# Patient Record
Sex: Male | Born: 1970 | Race: Asian | Hispanic: No | Marital: Single | Smoking: Never smoker
Health system: Southern US, Community
[De-identification: ages and names within clinical notes are randomized; demographics above are authoritative.]

---

## 2017-04-15 ENCOUNTER — Ambulatory Visit (INDEPENDENT_AMBULATORY_CARE_PROVIDER_SITE_OTHER): Payer: BLUE CROSS/BLUE SHIELD

## 2017-04-15 ENCOUNTER — Ambulatory Visit (INDEPENDENT_AMBULATORY_CARE_PROVIDER_SITE_OTHER): Payer: BLUE CROSS/BLUE SHIELD | Admitting: Physician Assistant

## 2017-04-15 ENCOUNTER — Other Ambulatory Visit: Payer: Self-pay

## 2017-04-15 ENCOUNTER — Encounter: Payer: Self-pay | Admitting: Physician Assistant

## 2017-04-15 VITALS — BP 150/93 | HR 71 | Resp 16 | Ht 66.0 in | Wt 185.2 lb

## 2017-04-15 DIAGNOSIS — M7989 Other specified soft tissue disorders: Secondary | ICD-10-CM | POA: Diagnosis not present

## 2017-04-15 DIAGNOSIS — M25471 Effusion, right ankle: Secondary | ICD-10-CM | POA: Diagnosis not present

## 2017-04-15 DIAGNOSIS — S99911A Unspecified injury of right ankle, initial encounter: Secondary | ICD-10-CM | POA: Diagnosis not present

## 2017-04-15 MED ORDER — NAPROXEN 500 MG PO TABS: 500.0000 mg | ORAL_TABLET | Freq: Two times a day (BID) | ORAL | 0 refills | Status: AC
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
## 2017-04-15 NOTE — Patient Instructions (Addendum)
Rest and elevate the affected painful area.  Apply cold compresses intermittently as needed.  As pain recedes, begin normal activities slowly as tolerated.  Call if symptoms persist.    IF you received an x-ray today, you will receive an invoice from [facility]. Please contact [facility] at [phone] with questions or concerns regarding your invoice.   IF you received labwork today, you will receive an invoice from [facility]. Please contact [facility] at [phone] with questions or concerns regarding your invoice.   Our billing staff will not be able to assist you with questions regarding bills from these companies.  You will be contacted with the lab results as soon as they are available. The fastest way to get your results is to activate your My Chart account. Instructions are located on the last page of this paperwork. If you have not heard from us regarding the results in 2 weeks, please contact this office.

## 2017-04-24 ENCOUNTER — Encounter: Payer: Self-pay | Admitting: Physician Assistant

## 2017-04-24 ENCOUNTER — Ambulatory Visit (INDEPENDENT_AMBULATORY_CARE_PROVIDER_SITE_OTHER): Payer: BLUE CROSS/BLUE SHIELD

## 2017-04-24 ENCOUNTER — Ambulatory Visit (INDEPENDENT_AMBULATORY_CARE_PROVIDER_SITE_OTHER): Payer: BLUE CROSS/BLUE SHIELD | Admitting: Physician Assistant

## 2017-04-24 VITALS — BP 130/84 | HR 90 | Temp 98.0°F | Resp 16 | Ht 66.0 in | Wt 186.6 lb

## 2017-04-24 DIAGNOSIS — M25571 Pain in right ankle and joints of right foot: Secondary | ICD-10-CM
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
## 2017-04-24 NOTE — Patient Instructions (Signed)
     IF you received an x-ray today, you will receive an invoice from [facility]. Please contact [facility] at [phone] with questions or concerns regarding your invoice.   IF you received labwork today, you will receive an invoice from [facility]. Please contact [facility] at [phone] with questions or concerns regarding your invoice.   Our billing staff will not be able to assist you with questions regarding bills from these companies.  You will be contacted with the lab results as soon as they are available. The fastest way to get your results is to activate your My Chart account. Instructions are located on the last page of this paperwork. If you have not heard from us regarding the results in 2 weeks, please contact this office.     

## 2017-05-09 ENCOUNTER — Other Ambulatory Visit: Payer: Self-pay | Admitting: Physician Assistant

## 2017-05-12 ENCOUNTER — Other Ambulatory Visit: Payer: Self-pay | Admitting: Physician Assistant

## 2017-07-28 ENCOUNTER — Ambulatory Visit: Payer: BLUE CROSS/BLUE SHIELD | Admitting: Physician Assistant

## 2017-09-05 ENCOUNTER — Ambulatory Visit: Payer: BLUE CROSS/BLUE SHIELD | Admitting: Physician Assistant

## 2017-11-22 IMAGING — DX DG ANKLE COMPLETE 3+V*R*
3 series · 3 of 3 positions shown · non-contrast
Comparison: 04/15/2017

CLINICAL DATA: RIGHT ankle pain, previously suspected fracture

EXAM:
RIGHT ANKLE - COMPLETE 3+ VIEW

[ankle ap]
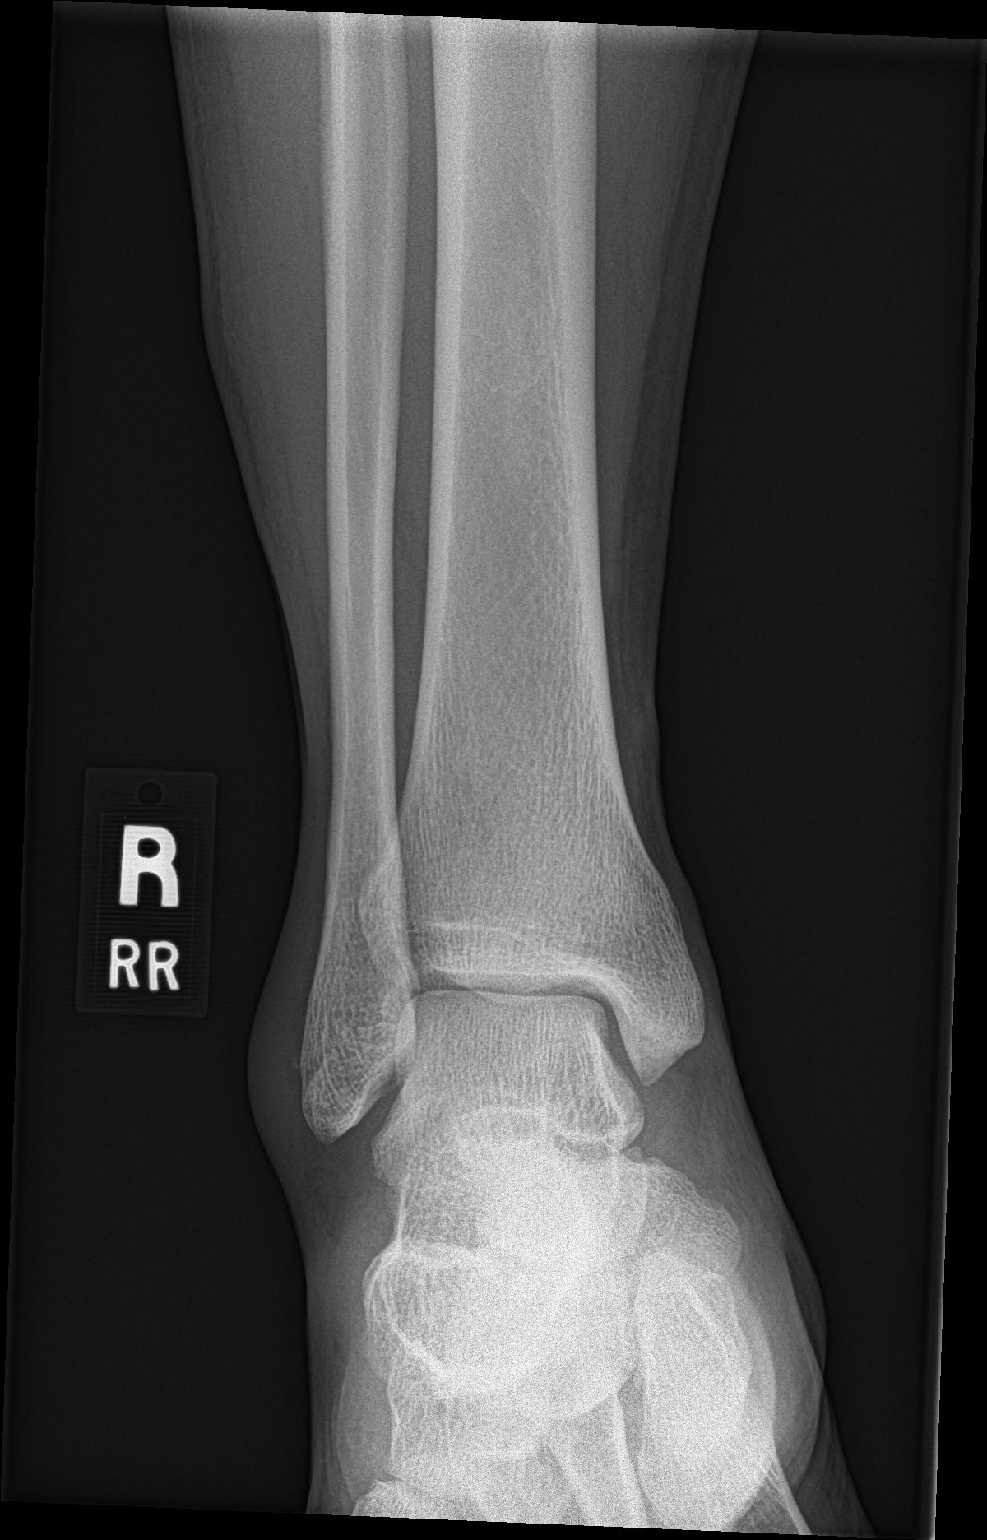

[ankle obl]
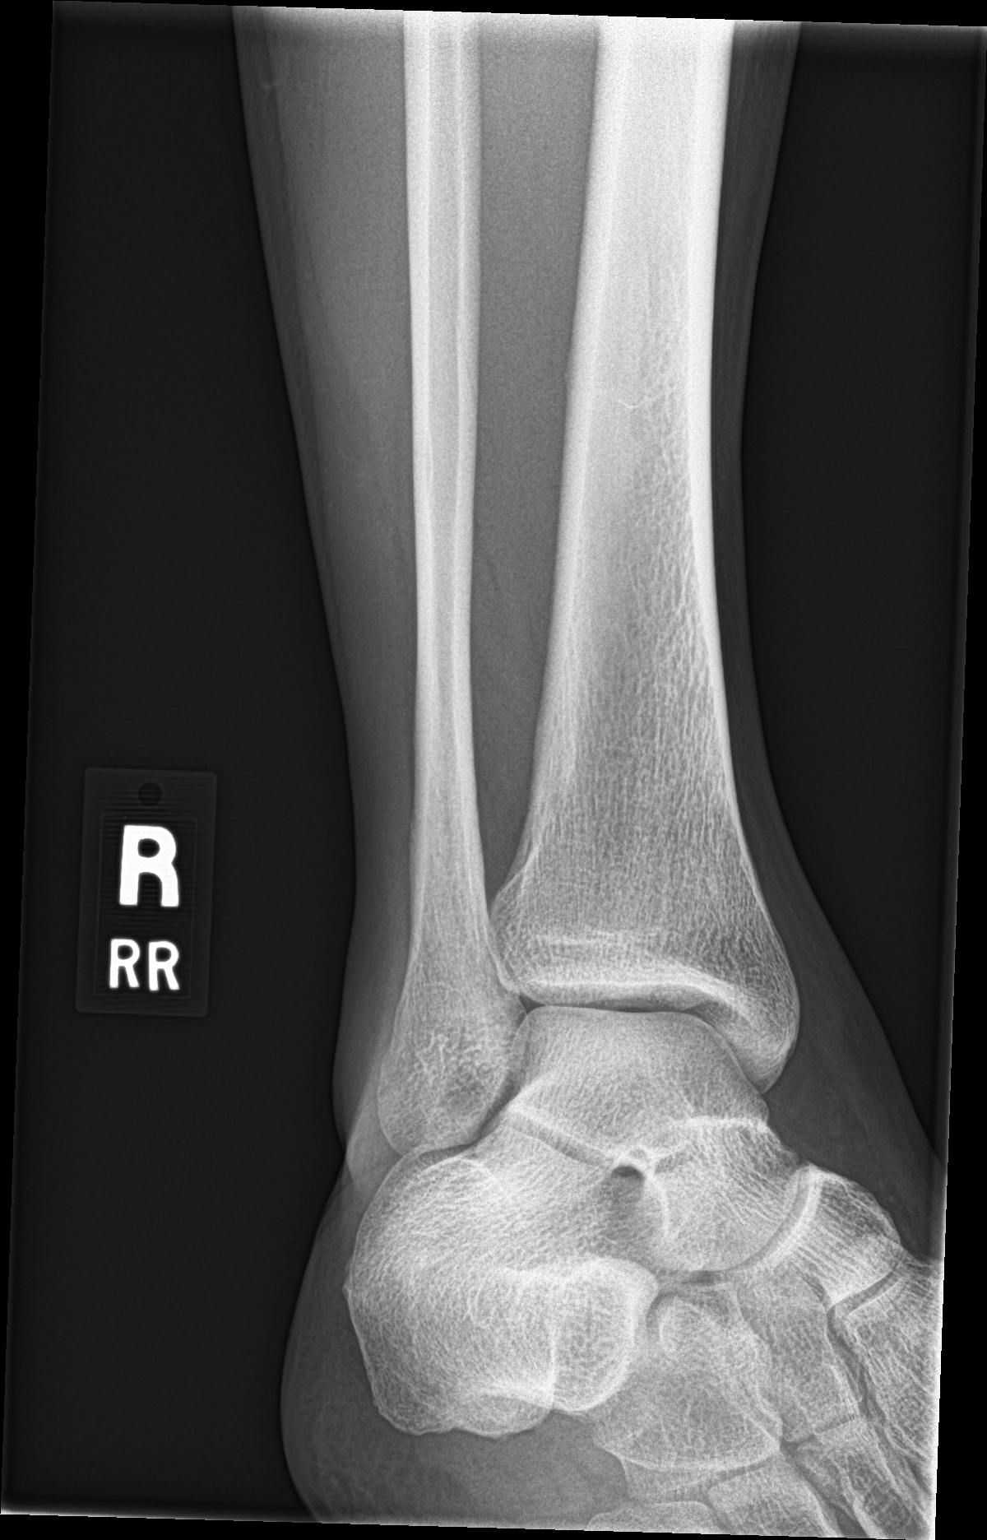

[ankle lat]
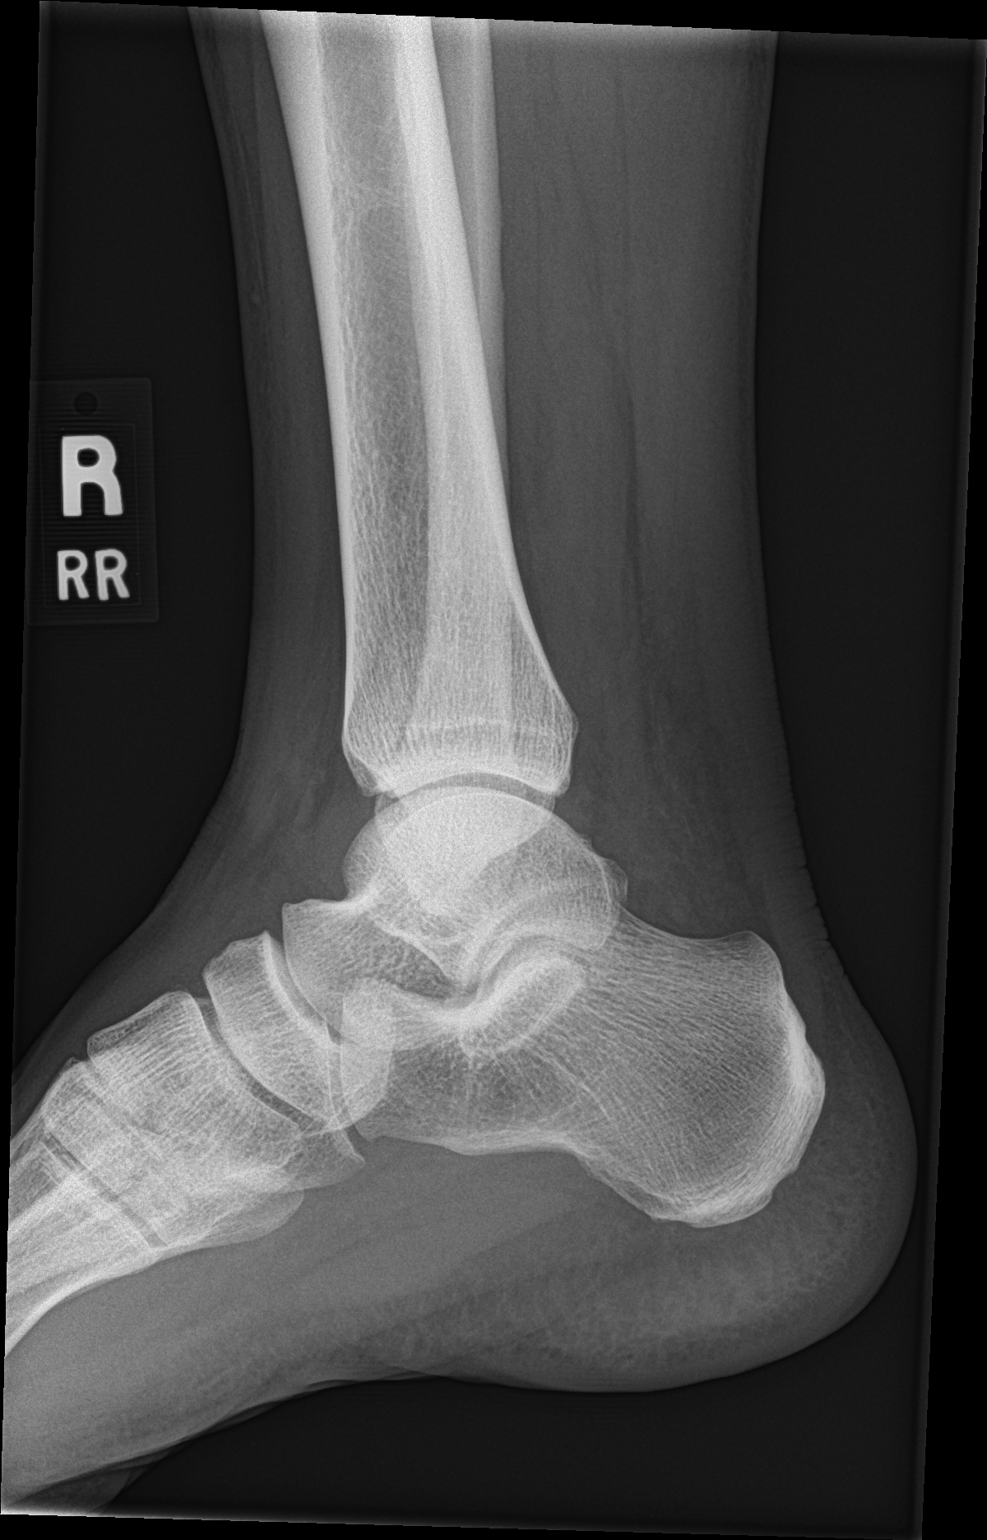

[3 of 3 positions shown; findings below may reference images not displayed]

FINDINGS: Osseous mineralization normal.

Ankle mortise intact.

Lateral soft tissue swelling, slightly decreased.

Again identified tiny calcific density adjacent to the lateral
margin of the lateral malleolus on the AP view.

On lateral view, a subtle question cortical angular change is
identified.

No definite resorption or sclerosis along a fracture plane is
identified however.

Finding remains suspicious for a nondisplaced transverse fracture of
lateral malleolus.

No additional fracture, dislocation or bone destruction.
IMPRESSION: Again identified subtle contour abnormality along the cortex of the
lateral malleolus with a tiny adjacent calcific density, cannot
exclude a subtle transverse nondisplaced lateral malleolar fracture.

No additional bony abnormalities identified.

## 2018-01-09 DIAGNOSIS — H04123 Dry eye syndrome of bilateral lacrimal glands: Secondary | ICD-10-CM | POA: Diagnosis not present

## 2018-01-09 DIAGNOSIS — H40033 Anatomical narrow angle, bilateral: Secondary | ICD-10-CM | POA: Diagnosis not present
